# Patient Record
Sex: Female | Born: 2000 | Race: Black or African American | Hispanic: No | Marital: Single | Smoking: Never smoker
Health system: Southern US, Community
[De-identification: ages and names within clinical notes are randomized; demographics above are authoritative.]

## PROBLEM LIST (undated history)

## (undated) DIAGNOSIS — E669 Obesity, unspecified: Secondary | ICD-10-CM

## (undated) DIAGNOSIS — L309 Dermatitis, unspecified: Secondary | ICD-10-CM

## (undated) DIAGNOSIS — R030 Elevated blood-pressure reading, without diagnosis of hypertension: Secondary | ICD-10-CM

## (undated) HISTORY — DX: Dermatitis, unspecified: L30.9

## (undated) HISTORY — DX: Obesity, unspecified: E66.9

## (undated) HISTORY — DX: Elevated blood-pressure reading, without diagnosis of hypertension: R03.0

---
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
## 2022-05-02 ENCOUNTER — Encounter: Payer: Self-pay | Admitting: Podiatry

## 2022-05-02 ENCOUNTER — Ambulatory Visit (INDEPENDENT_AMBULATORY_CARE_PROVIDER_SITE_OTHER): Payer: BC Managed Care – PPO

## 2022-05-02 ENCOUNTER — Ambulatory Visit: Payer: BC Managed Care – PPO | Admitting: Podiatry

## 2022-05-02 DIAGNOSIS — M722 Plantar fascial fibromatosis: Secondary | ICD-10-CM | POA: Diagnosis not present

## 2022-05-02 DIAGNOSIS — M79671 Pain in right foot: Secondary | ICD-10-CM

## 2022-05-02 MED ORDER — MELOXICAM 15 MG PO TABS: 15.0000 mg | ORAL_TABLET | Freq: Every day | ORAL | 0 refills | Status: DC

## 2022-05-02 NOTE — Patient Instructions (Signed)

## 2022-05-02 NOTE — Progress Notes (Signed)
  Subjective:  Patient ID: Kayla Webb, female    DOB: 2000-10-27,   MRN: [id number]  Chief Complaint  Patient presents with   Foot Pain     Right foot heel pain , on going for years. Patient states the pain is constant     22 y.o. female presents for concern of right heel pain that has been ongoing for years and been constant.  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 Has tried some gel inserts but has not had any relief. Was told she had plantar fasciitis. . Denies any other pedal complaints. Denies n/v/f/c.   Past Medical History:  Diagnosis Date   Eczema    Elevated BP 05/24/2012   Likely related to weight   Obesity     Objective:  Physical Exam: Vascular: DP/PT pulses 2/4 bilateral. CFT <3 seconds. Normal hair growth on digits. No edema.  Skin. No lacerations or abrasions bilateral feet.  Musculoskeletal: MMT 5/5 bilateral lower extremities in DF, PF, Inversion and Eversion. Deceased ROM in DF of ankle joint.  Tender to medial calanceal tubercle on the right. No pain along achilles or PT tendon. No pain with calcaneal squeeze.  Neurological: Sensation intact to light touch.   Assessment:   1. Plantar fasciitis, right      Plan:  Patient was evaluated and treated and all questions answered. Discussed plantar fasciitis with patient.  X-rays reviewed and discussed with patient. No acute fractures or dislocations noted. Mild spurring noted at inferior calcaneus.  Reviewed notes from previous provider.  Discussed treatment options including, ice, NSAIDS, supportive shoes, bracing, and stretching. Stretching exercises provided to be done on a daily basis.   Prescription for meloxicam provided and sent to pharmacy. Pf brace dispensed Follow-up 6 weeks or sooner if any problems arise. In the meantime, encouraged to call the office with any questions, concerns, change in symptoms.     Lorenda Peck, DPM

## 2022-05-31 ENCOUNTER — Other Ambulatory Visit: Payer: Self-pay | Admitting: Podiatry

## 2022-06-13 ENCOUNTER — Ambulatory Visit (INDEPENDENT_AMBULATORY_CARE_PROVIDER_SITE_OTHER): Payer: BC Managed Care – PPO | Admitting: Podiatry

## 2022-06-13 DIAGNOSIS — Z91199 Patient's noncompliance with other medical treatment and regimen due to unspecified reason: Secondary | ICD-10-CM

## 2022-06-13 NOTE — Progress Notes (Signed)
No show

## 2022-11-20 ENCOUNTER — Encounter: Payer: Self-pay | Admitting: *Deleted

## 2023-11-27 ENCOUNTER — Encounter: Payer: Self-pay | Admitting: *Deleted
